# Patient Record
Sex: Female | Born: 1935 | Race: White | Hispanic: No | Smoking: Never smoker
Health system: Southern US, Community
[De-identification: ages and names within clinical notes are randomized; demographics above are authoritative.]

## PROBLEM LIST (undated history)

## (undated) DIAGNOSIS — M1990 Unspecified osteoarthritis, unspecified site: Secondary | ICD-10-CM

## (undated) DIAGNOSIS — E785 Hyperlipidemia, unspecified: Secondary | ICD-10-CM

## (undated) DIAGNOSIS — I10 Essential (primary) hypertension: Secondary | ICD-10-CM

## (undated) DIAGNOSIS — F329 Major depressive disorder, single episode, unspecified: Secondary | ICD-10-CM

## (undated) DIAGNOSIS — F32A Depression, unspecified: Secondary | ICD-10-CM

---

## 2013-10-10 ENCOUNTER — Emergency Department (HOSPITAL_COMMUNITY): Payer: Medicare Other

## 2013-10-10 ENCOUNTER — Emergency Department (HOSPITAL_COMMUNITY)
Admission: EM | Admit: 2013-10-10 | Discharge: 2013-10-10 | Disposition: A | Discharge status: Home / Self Care | Payer: Medicare Other | Attending: Emergency Medicine | Admitting: Emergency Medicine

## 2013-10-10 ENCOUNTER — Encounter (HOSPITAL_COMMUNITY): Payer: Self-pay | Admitting: *Deleted

## 2013-10-10 ENCOUNTER — Ambulatory Visit (HOSPITAL_COMMUNITY)
Admission: RE | Admit: 2013-10-10 | Discharge: 2013-10-10 | Disposition: A | Discharge status: Home / Self Care | Payer: Medicare Other | Attending: Psychiatry | Admitting: Psychiatry

## 2013-10-10 ENCOUNTER — Encounter (HOSPITAL_COMMUNITY): Payer: Self-pay | Admitting: Emergency Medicine

## 2013-10-10 DIAGNOSIS — F3289 Other specified depressive episodes: Secondary | ICD-10-CM | POA: Insufficient documentation

## 2013-10-10 DIAGNOSIS — F329 Major depressive disorder, single episode, unspecified: Secondary | ICD-10-CM | POA: Insufficient documentation

## 2013-10-10 DIAGNOSIS — G479 Sleep disorder, unspecified: Secondary | ICD-10-CM | POA: Insufficient documentation

## 2013-10-10 DIAGNOSIS — R630 Anorexia: Secondary | ICD-10-CM | POA: Insufficient documentation

## 2013-10-10 DIAGNOSIS — R634 Abnormal weight loss: Secondary | ICD-10-CM | POA: Insufficient documentation

## 2013-10-10 DIAGNOSIS — R5381 Other malaise: Secondary | ICD-10-CM | POA: Insufficient documentation

## 2013-10-10 DIAGNOSIS — F1310 Sedative, hypnotic or anxiolytic abuse, uncomplicated: Secondary | ICD-10-CM | POA: Insufficient documentation

## 2013-10-10 LAB — ETHANOL: Alcohol, Ethyl (B): <11 mg/dL (ref 0–11)

## 2013-10-10 LAB — COMPREHENSIVE METABOLIC PANEL
Alkaline Phosphatase: 62 U/L (ref 39–117)
BUN: 18 mg/dL (ref 6–23)
Chloride: 100 mEq/L (ref 96–112)
GFR calc Af Amer: >90 mL/min (ref >90)
GFR calc non Af Amer: 84 mL/min — ABNORMAL LOW (ref >90)
Glucose, Bld: 109 mg/dL — ABNORMAL HIGH (ref 70–99)
Potassium: 3.7 mEq/L (ref 3.5–5.1)
Total Bilirubin: 0.2 mg/dL — ABNORMAL LOW (ref 0.3–1.2)
Total Protein: 7.7 g/dL (ref 6.0–8.3)

## 2013-10-10 LAB — CBC
HCT: 39.9 % (ref 36.0–46.0)
Hemoglobin: 13.7 g/dL (ref 12.0–15.0)
MCH: 30.1 pg (ref 26.0–34.0)
MCHC: 34.3 g/dL (ref 30.0–36.0)
WBC: 9 10*3/uL (ref 4.0–10.5)

## 2013-10-10 LAB — RAPID URINE DRUG SCREEN, HOSP PERFORMED: Barbiturates: NOT DETECTED

## 2013-10-10 LAB — ACETAMINOPHEN LEVEL: Acetaminophen (Tylenol), Serum: <15 ug/mL (ref 10–30)

## 2013-10-10 MED ORDER — ACETAMINOPHEN 325 MG PO TABS: 650.0000 mg | ORAL_TABLET | ORAL | Status: DC | PRN

## 2013-10-10 NOTE — ED Notes (Signed)
Pt.'s personal belongs endorsed to [facility],2 bags.

## 2013-10-10 NOTE — BH Assessment (Signed)
LCSW spoke to Amb[place] at [facility] [phone]) who reports they are accepting patient and have a bed available.  Hardie Lora, RN notified who will communicate this to patient.  LCSW notified Hardie Lora, RN, that patient is being accepted to the care of Dr. Robet Leu and RN is to call report to [phone].  LCSW spoke to Dr. Elesa Massed to notify of patient being accepted to [facility].    Tessa Lerner, LCSW, MSW 8:34 PM 10/10/2013

## 2013-10-10 NOTE — ED Provider Notes (Signed)
CSN: [id number]     Arrival date & time 10/10/13  1508 History   First MD Initiated Contact with Patient 10/10/13 1528     Chief Complaint  Patient presents with  . Medical Clearance    HPI  Bertrice Leder is a 79 y.o. female with a PMH of depression who presents to the ED for evaluation of medical clearance.  History was provided by the patient.  Patient states that she has been depressed for the past 4 weeks. She states that she has a history of depression. She was [place] hometown for [place] a few weeks ago. Her anti-depressive medications were changed at that time. She was previously on Paxil and is now on Cymbalta. She states she's been taking medications however they are "not working". She states that she has not been eating.  She has had a 20 pound weight loss in the past 2 months. She said nothing taste good and she is just not hungry. She states that her husband death about a year ago has been hard on her. She also has to care for her son who is a paraplegic. She states she is here because she "needs help."  She denies any suicidal ideation or attempts prior to arrival. She denies any alcohol or drug use. No tobacco use.  She has a chronic eye condition which is being managed (glaucoma) with no acute changes.  No fever, chills, abdominal pain, nausea, vomiting, chest pain, shortness of breath, myalgias, or leg edema.      History reviewed. No pertinent past medical history. History reviewed. No pertinent past surgical history. No family history on file. History  Substance Use Topics  . Smoking status: Never Smoker   . Smokeless tobacco: Not on file  . Alcohol Use: No   OB History   Grav Para Term Preterm Abortions TAB SAB Ect Mult Living                 Review of Systems  Constitutional: Positive for appetite change, fatigue and unexpected weight change. Negative for fever, chills, diaphoresis and activity change.  HENT: Negative for congestion,  rhinorrhea and sore throat.   Eyes: Positive for pain (at baseline) and visual disturbance (at baseline).  Respiratory: Negative for cough and shortness of breath.   Cardiovascular: Negative for chest pain and leg swelling.  Gastrointestinal: Negative for nausea, vomiting, abdominal pain, diarrhea and constipation.  Genitourinary: Negative for dysuria.  Musculoskeletal: Negative for back pain, gait problem and neck pain.  Skin: Negative for wound.  Neurological: Negative for syncope, weakness and headaches.  Psychiatric/Behavioral: Positive for sleep disturbance and dysphoric mood. Negative for suicidal ideas and self-injury.    Allergies  Review of patient's allergies indicates not on file.  Home Medications  No current outpatient prescriptions on file. BP 148/84  Pulse 85  Temp(Src) 97.7 F (36.5 C) (Oral)  Resp 16  SpO2 97%  Filed Vitals:   10/10/13 1530 10/10/13 1827 10/10/13 2027  BP: 148/84 144/56 152/69  Pulse: 85 78 84  Temp: 97.7 F (36.5 C)    TempSrc: Oral    Resp: 16 18 18   SpO2: 97% 98% 95%     Physical Exam  Nursing note and vitals reviewed. Constitutional: She is oriented to person, place, and time. She appears well-developed and well-nourished. No distress.  HENT:  Head: Normocephalic and atraumatic.  Right Ear: External ear normal.  Left Ear: External ear normal.  Nose: Nose normal.  Mouth/Throat: Oropharynx is clear  and moist.  Eyes: Conjunctivae are normal. Right eye exhibits no discharge. Left eye exhibits no discharge.  Neck: Normal range of motion. Neck supple.  Cardiovascular: Normal rate, regular rhythm and normal heart sounds.  Exam reveals no gallop and no friction rub.   No murmur heard. Pulmonary/Chest: Effort normal and breath sounds normal. No respiratory distress. She has no wheezes. She has no rales. She exhibits no tenderness.  Abdominal: Soft. She exhibits no distension. There is no tenderness.  Musculoskeletal: Normal range of  motion. She exhibits no edema and no tenderness.  Neurological: She is alert and oriented to person, place, and time.  Skin: Skin is warm and dry. She is not diaphoretic.    ED Course  Procedures (including critical care time) Labs Review Labs Reviewed  ACETAMINOPHEN LEVEL  CBC  COMPREHENSIVE METABOLIC PANEL  ETHANOL  SALICYLATE LEVEL  URINE RAPID DRUG SCREEN (HOSP PERFORMED)   Imaging Review No results found.  EKG Interpretation     Ventricular Rate:  77 PR Interval:  167 QRS Duration: 92 QT Interval:  397 QTC Calculation: 449 R Axis:   6 Text Interpretation:  Sinus rhythm Otherwise normal ECG           Results for orders placed during the hospital encounter of 10/10/13  ACETAMINOPHEN LEVEL      Result Value Range   Acetaminophen (Tylenol), Serum <15.0  10 - 30 ug/mL  CBC      Result Value Range   WBC 9.0  4.0 - 10.5 K/uL   RBC 4.55  3.87 - 5.11 MIL/uL   Hemoglobin 13.7  12.0 - 15.0 g/dL   HCT 16.1  09.6 - 04.5 %   MCV 87.7  78.0 - 100.0 fL   MCH 30.1  26.0 - 34.0 pg   MCHC 34.3  30.0 - 36.0 g/dL   RDW 40.9  81.1 - 91.4 %   Platelets 290  150 - 400 K/uL  COMPREHENSIVE METABOLIC PANEL      Result Value Range   Sodium 138  135 - 145 mEq/L   Potassium 3.7  3.5 - 5.1 mEq/L   Chloride 100  96 - 112 mEq/L   CO2 26  19 - 32 mEq/L   Glucose, Bld 109 (*) 70 - 99 mg/dL   BUN 18  6 - 23 mg/dL   Creatinine, Ser 7.82  0.50 - 1.10 mg/dL   Calcium 9.8  8.4 - 95.6 mg/dL   Total Protein 7.7  6.0 - 8.3 g/dL   Albumin 4.0  3.5 - 5.2 g/dL   AST 22  0 - 37 U/L   ALT 16  0 - 35 U/L   Alkaline Phosphatase 62  39 - 117 U/L   Total Bilirubin 0.2 (*) 0.3 - 1.2 mg/dL   GFR calc non Af Amer 84 (*) >90 mL/min   GFR calc Af Amer >90  >90 mL/min  ETHANOL      Result Value Range   Alcohol, Ethyl (B) <11  0 - 11 mg/dL  SALICYLATE LEVEL      Result Value Range   Salicylate Lvl <2.0 (*) 2.8 - 20.0 mg/dL  URINE RAPID DRUG SCREEN (HOSP PERFORMED)      Result Value Range    Opiates NONE DETECTED  NONE DETECTED   Cocaine NONE DETECTED  NONE DETECTED   Benzodiazepines POSITIVE (*) NONE DETECTED   Amphetamines NONE DETECTED  NONE DETECTED   Tetrahydrocannabinol NONE DETECTED  NONE DETECTED   Barbiturates NONE DETECTED  NONE DETECTED    DG Chest 2 View (Final result)  Result time: 10/10/13 17:13:51    Final result by Rad Results In Interface (10/10/13 17:13:51)    Narrative:   CLINICAL DATA: Chest pain. Hypertension.  EXAM: CHEST 2 VIEW  COMPARISON: None.  FINDINGS: The heart size and mediastinal contours are within normal limits. Both lungs are clear. The visualized skeletal structures are unremarkable.  IMPRESSION: No active cardiopulmonary disease.   Electronically Signed By: Myles Rosenthal M.D. On: 10/10/2013 17:13     MDM   1. Depression     Lailee Hoelzel is a 79 y.o. female with a PMH of depression who presents to the ED for evaluation of medical clearance.   Acetaminophen level, CBC, CMP, ethanol, salicylate level, urine drug screen, EKG and chest x-ray ordered for clearance.   Consults  4:11 PM = Spoke with behavioral health.  Patient will be sent to [facility] transfer medical clearance.     Patient medically cleared.  Awaiting bed at [facility] for transfer and further evaluation and management of her depression.  No SI/HI.  Patient in agreement with discharge and plan.     Final impressions: 1. Depression   Luiz Iron PA-C   This patient was discussed with Dr. Meryl Dare, PA-C 10/11/13 1038
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
## 2013-10-10 NOTE — ED Notes (Addendum)
Pt presents to ed with c/o "severe depression". Pt reports feeling hopeless ever since her husband passed away year ago. Pt sts she has no will to live any longer but denies any SI/HI. Pt sts" I don't feel like there is anything to live for, but I would never do anything to hurt myself."  Daughter at bedside sts pt is not eating or drinking almost at all and refuses to get out of bed.

## 2013-10-10 NOTE — ED Provider Notes (Signed)
Medical screening examination/treatment/procedure(s) were conducted as a shared visit with non-physician practitioner(s) and myself.  I personally evaluated the patient during the encounter.  EKG Interpretation     Ventricular Rate:  77 PR Interval:  167 QRS Duration: 92 QT Interval:  397 QTC Calculation: 449 R Axis:   6 Text Interpretation:  Sinus rhythm Otherwise normal ECG            Patient is a 79 year old female with a history of depression currently on Cymbalta presents emergency department with worsening depression over the past several weeks since her husband died. No suicidal or homicidal ideation. No delusions or hallucinations. No current medical complaints. Behavioral health seen her and she's been accepted to her facility in [place]. She has agreed to voluntary treatment. Labs, chest x-ray unremarkable. EKG within normal limits.  Lindsey Maw Zahara Rembert, DO 10/10/13 1801

## 2013-10-10 NOTE — Progress Notes (Signed)
Patient confirms her pcp is Dr.  Delorise Jackson.  System updated.

## 2013-10-10 NOTE — ED Notes (Addendum)
Please call pt's daughter Dewayne Hatch when pt is ready for transfer.

## 2013-10-10 NOTE — Progress Notes (Addendum)
Contacted [facility] [phone]) to inquire about bed availability, per Delorise Shiner, possible availability in the morning, referral faxed to be reviewed. Rodman Pickle, MHT  Referral has also been faxed to [facility]. Debbe Odea Astryd Pearcy,MHT

## 2013-10-10 NOTE — ED Notes (Signed)
Patient's daughter has patient's belongings.

## 2013-10-10 NOTE — ED Notes (Addendum)
Pt belongings: underwear x 5, bra x2,   1 pink nightgown,  1 pink housecoat, pink/white pajamas (top and bottom) 1 pair, light blue gown, socks x 3 pairs, plastic shower cap, hair, brush, hair comb 2 lotions, 1 bottle of hair spray, 1 deodorant, 1 lipstick, 1 black turtle neck blouse, 1 beige pants, 1 kaki pants,1 brown jeans, 1 white sweater,1 white sweater, 1 pair of black slip on shoes locked in locker # 32

## 2013-10-10 NOTE — ED Notes (Signed)
Bed: ZO10 Expected date:  Expected time:  Means of arrival:  Comments: [facility]- [place]man[place], SI

## 2014-03-13 IMAGING — CR DG CHEST 2V
2 series · 2 of 2 positions shown · non-contrast
Comparison: None.

CLINICAL DATA: Chest pain. Hypertension.

EXAM:
CHEST  2 VIEW

[w chest pa]
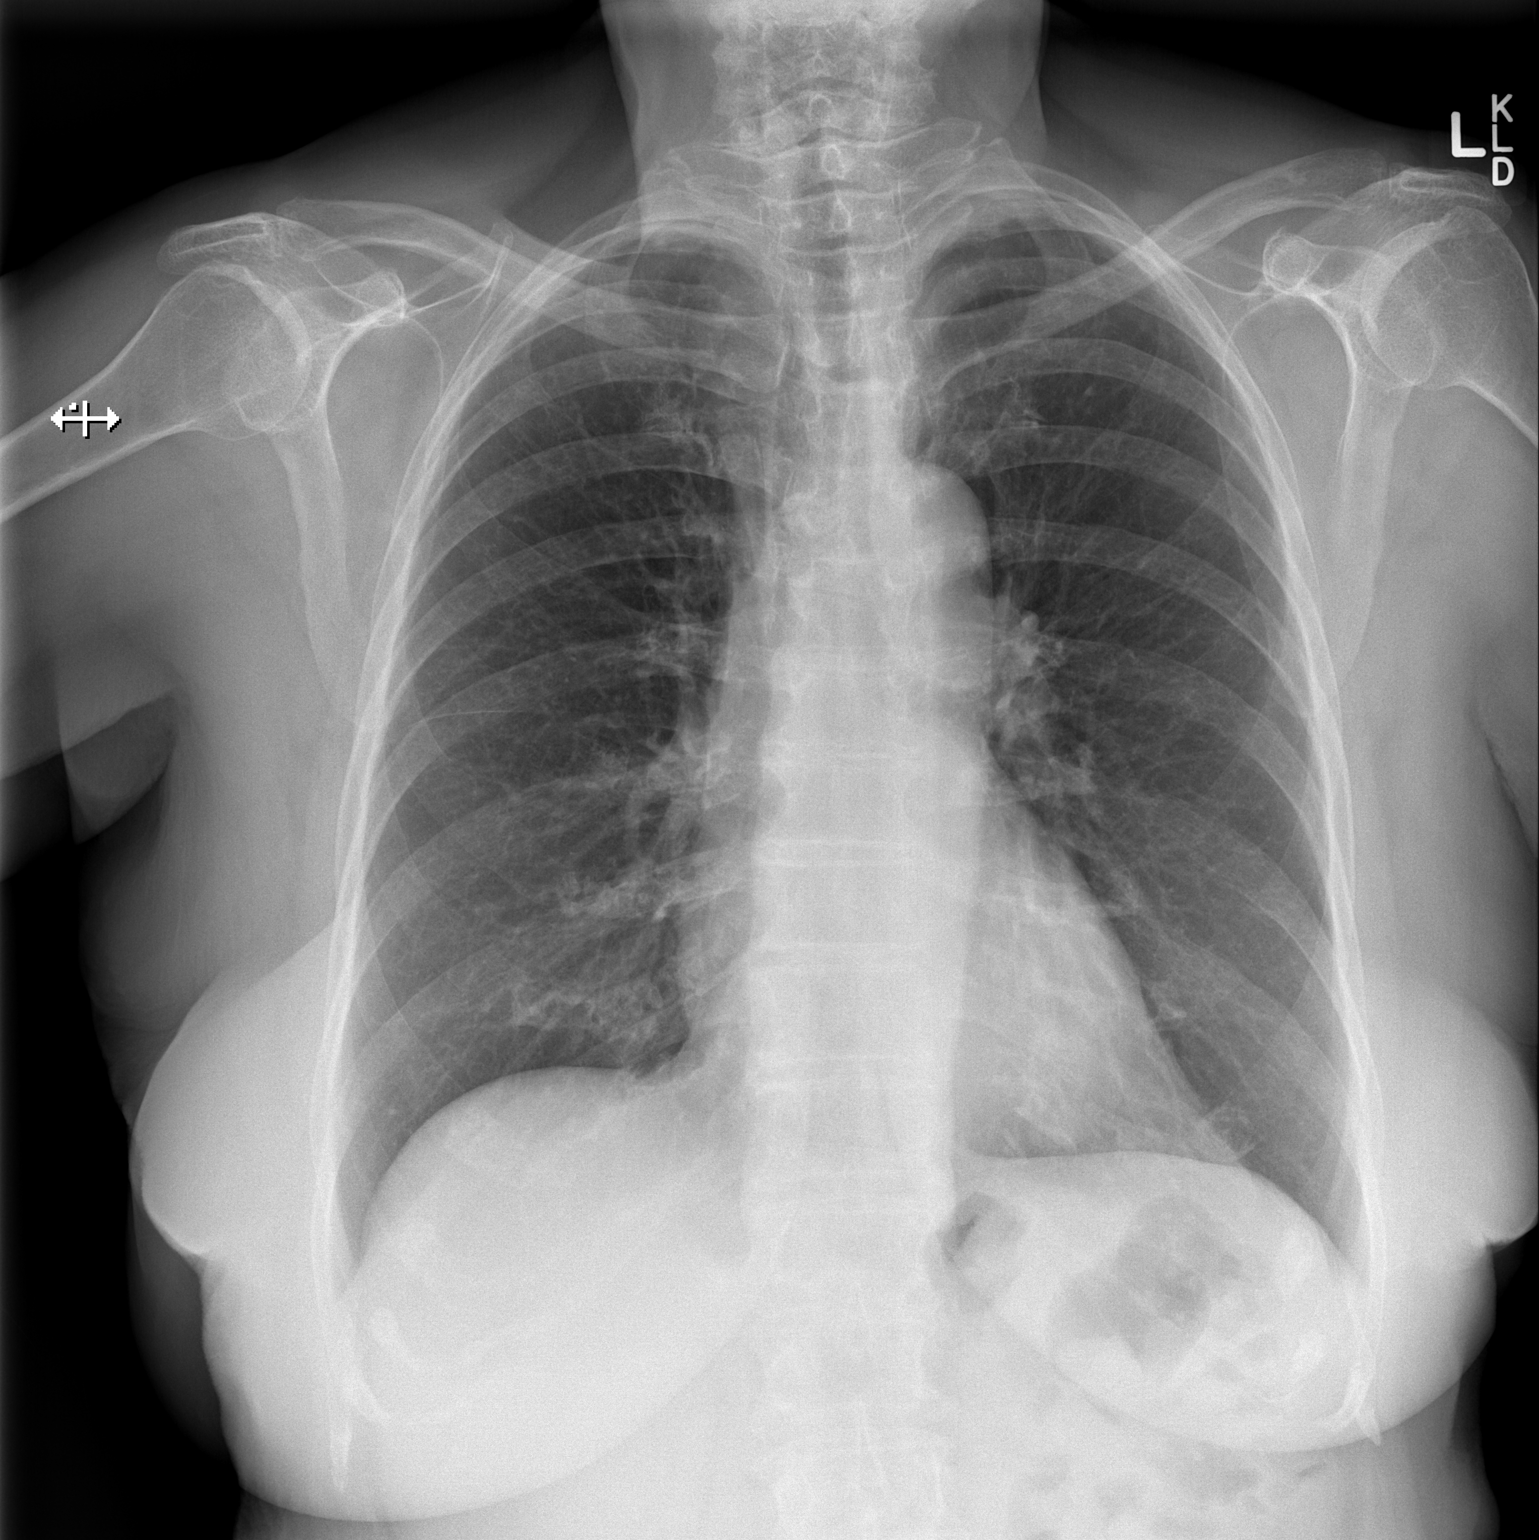

[w chest lat]
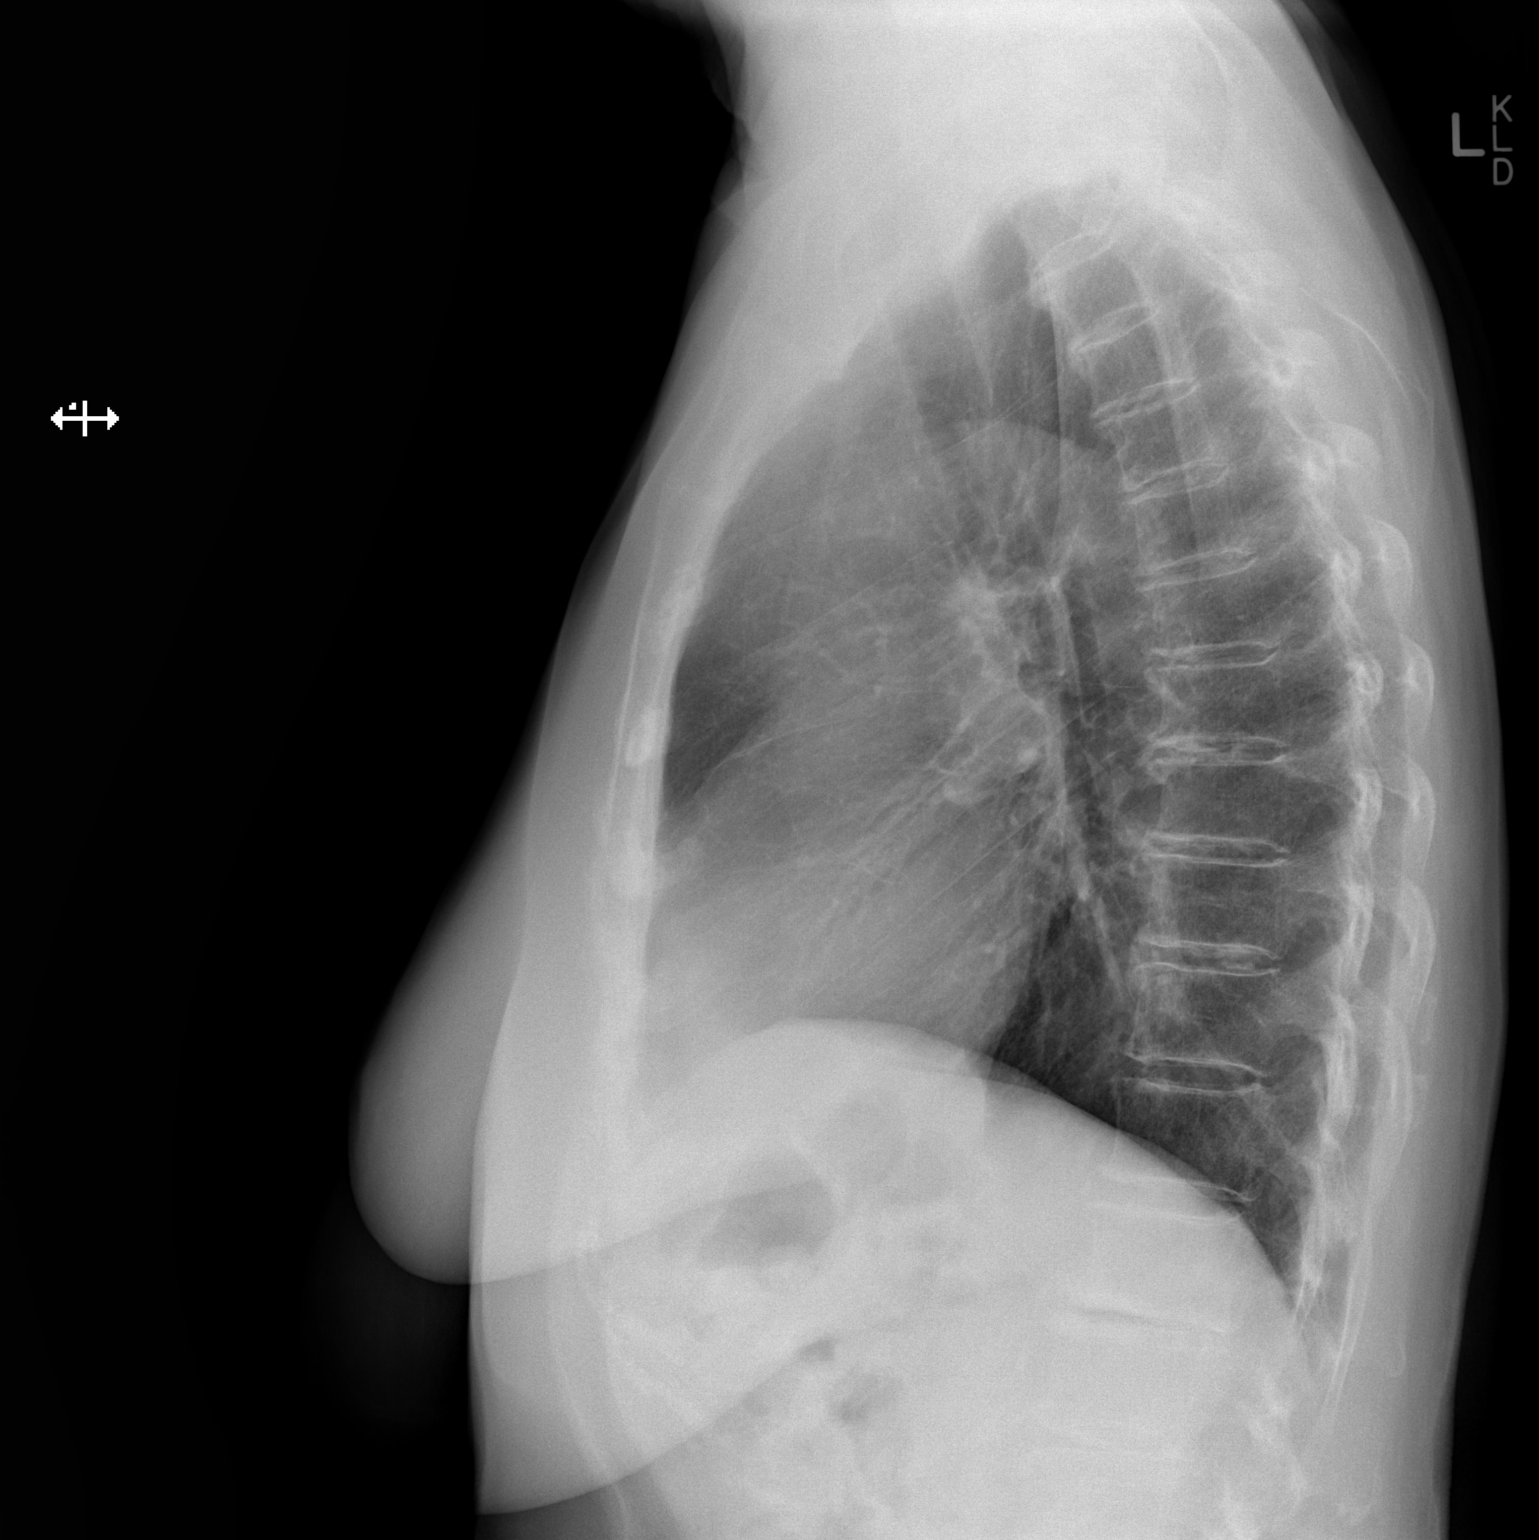

[2 of 2 positions shown; findings below may reference images not displayed]

FINDINGS: The heart size and mediastinal contours are within normal limits.
Both lungs are clear. The visualized skeletal structures are
unremarkable.
IMPRESSION: No active cardiopulmonary disease.

## 2014-04-09 ENCOUNTER — Encounter (HOSPITAL_COMMUNITY): Payer: Self-pay | Admitting: Emergency Medicine

## 2014-04-09 ENCOUNTER — Encounter (HOSPITAL_COMMUNITY): Payer: Self-pay

## 2014-04-09 ENCOUNTER — Ambulatory Visit (HOSPITAL_COMMUNITY)
Admission: RE | Admit: 2014-04-09 | Discharge: 2014-04-09 | Disposition: A | Discharge status: Home / Self Care | Payer: Medicare Other | Source: Home / Self Care | Attending: Psychiatry | Admitting: Psychiatry

## 2014-04-09 ENCOUNTER — Emergency Department (HOSPITAL_COMMUNITY)
Admission: EM | Admit: 2014-04-09 | Discharge: 2014-04-09 | Disposition: A | Discharge status: Other Acute Inpatient Hospital | Payer: Medicare Other | Attending: Emergency Medicine | Admitting: Emergency Medicine

## 2014-04-09 ENCOUNTER — Observation Stay (HOSPITAL_COMMUNITY)
Admission: AD | Admit: 2014-04-09 | Discharge: 2014-04-10 | Disposition: A | Discharge status: Other Acute Inpatient Hospital | Payer: Medicare Other | Source: Intra-hospital | Attending: Psychiatry | Admitting: Psychiatry

## 2014-04-09 DIAGNOSIS — R5381 Other malaise: Secondary | ICD-10-CM | POA: Diagnosis not present

## 2014-04-09 DIAGNOSIS — F329 Major depressive disorder, single episode, unspecified: Secondary | ICD-10-CM

## 2014-04-09 DIAGNOSIS — M129 Arthropathy, unspecified: Secondary | ICD-10-CM | POA: Diagnosis not present

## 2014-04-09 DIAGNOSIS — I10 Essential (primary) hypertension: Secondary | ICD-10-CM | POA: Insufficient documentation

## 2014-04-09 DIAGNOSIS — R5383 Other fatigue: Secondary | ICD-10-CM

## 2014-04-09 DIAGNOSIS — Z008 Encounter for other general examination: Secondary | ICD-10-CM | POA: Diagnosis present

## 2014-04-09 DIAGNOSIS — E785 Hyperlipidemia, unspecified: Secondary | ICD-10-CM | POA: Diagnosis not present

## 2014-04-09 DIAGNOSIS — Z79899 Other long term (current) drug therapy: Secondary | ICD-10-CM | POA: Diagnosis not present

## 2014-04-09 DIAGNOSIS — G479 Sleep disorder, unspecified: Secondary | ICD-10-CM | POA: Diagnosis not present

## 2014-04-09 HISTORY — DX: Hyperlipidemia, unspecified: E78.5

## 2014-04-09 HISTORY — DX: Major depressive disorder, single episode, unspecified: F32.9

## 2014-04-09 HISTORY — DX: Unspecified osteoarthritis, unspecified site: M19.90

## 2014-04-09 HISTORY — DX: Depression, unspecified: F32.A

## 2014-04-09 HISTORY — DX: Essential (primary) hypertension: I10

## 2014-04-09 LAB — RAPID URINE DRUG SCREEN, HOSP PERFORMED
Amphetamines: NOT DETECTED
Barbiturates: NOT DETECTED
Benzodiazepines: NOT DETECTED
COCAINE: NOT DETECTED
Opiates: NOT DETECTED
TETRAHYDROCANNABINOL: NOT DETECTED

## 2014-04-09 LAB — COMPREHENSIVE METABOLIC PANEL
ALT: 15 U/L (ref 0–35)
AST: 19 U/L (ref 0–37)
Albumin: 3.9 g/dL (ref 3.5–5.2)
Alkaline Phosphatase: 61 U/L (ref 39–117)
BUN: 16 mg/dL (ref 6–23)
CHLORIDE: 95 meq/L — AB (ref 96–112)
CO2: 31 meq/L (ref 19–32)
Calcium: 9.8 mg/dL (ref 8.4–10.5)
Creatinine, Ser: 0.71 mg/dL (ref 0.50–1.10)
GFR calc Af Amer: >90 mL/min (ref >90)
GFR, EST NON AFRICAN AMERICAN: 81 mL/min — AB (ref >90)
Glucose, Bld: 120 mg/dL — ABNORMAL HIGH (ref 70–99)
Potassium: 4.1 mEq/L (ref 3.7–5.3)
SODIUM: 137 meq/L (ref 137–147)
Total Bilirubin: 0.4 mg/dL (ref 0.3–1.2)
Total Protein: 7.7 g/dL (ref 6.0–8.3)

## 2014-04-09 LAB — CBC
HEMATOCRIT: 37.4 % (ref 36.0–46.0)
Hemoglobin: 12.5 g/dL (ref 12.0–15.0)
MCH: 29.6 pg (ref 26.0–34.0)
MCHC: 33.4 g/dL (ref 30.0–36.0)
MCV: 88.4 fL (ref 78.0–100.0)
Platelets: 273 10*3/uL (ref 150–400)
RBC: 4.23 MIL/uL (ref 3.87–5.11)
RDW: 13 % (ref 11.5–15.5)
WBC: 8 10*3/uL (ref 4.0–10.5)

## 2014-04-09 LAB — ETHANOL: Alcohol, Ethyl (B): <11 mg/dL (ref 0–11)

## 2014-04-09 MED ORDER — ALUM & MAG HYDROXIDE-SIMETH 200-200-20 MG/5ML PO SUSP: 30.0000 mL | ORAL | Status: DC | PRN

## 2014-04-09 MED ORDER — ACETAMINOPHEN 325 MG PO TABS: 650.0000 mg | ORAL_TABLET | ORAL | Status: DC | PRN

## 2014-04-09 MED ORDER — LOSARTAN POTASSIUM 50 MG PO TABS
50.0000 mg | ORAL_TABLET | Freq: Every day | ORAL | Status: DC
Start: 2014-04-10 — End: 2014-04-11
  Administered 2014-04-10: 50 mg via ORAL
  Filled 2014-04-09 (×2): qty 1

## 2014-04-09 MED ORDER — TRAZODONE HCL 50 MG PO TABS
50.0000 mg | ORAL_TABLET | Freq: Every day | ORAL | Status: DC
  Administered 2014-04-09: 50 mg via ORAL
  Filled 2014-04-09 (×3): qty 1

## 2014-04-09 MED ORDER — LEVOTHYROXINE SODIUM 100 MCG PO TABS
100.0000 ug | ORAL_TABLET | Freq: Every day | ORAL | Status: DC
  Administered 2014-04-10: 100 ug via ORAL
  Filled 2014-04-09 (×3): qty 1

## 2014-04-09 MED ORDER — SIMVASTATIN 20 MG PO TABS
20.0000 mg | ORAL_TABLET | Freq: Every evening | ORAL | Status: DC
  Administered 2014-04-10: 20 mg via ORAL
  Filled 2014-04-09 (×2): qty 1

## 2014-04-09 MED ORDER — MAGNESIUM HYDROXIDE 400 MG/5ML PO SUSP: 30.0000 mL | Freq: Every day | ORAL | Status: DC | PRN

## 2014-04-09 MED ORDER — ATENOLOL 50 MG PO TABS
50.0000 mg | ORAL_TABLET | Freq: Two times a day (BID) | ORAL | Status: DC
  Administered 2014-04-10 (×2): 50 mg via ORAL
  Filled 2014-04-09 (×4): qty 1
  Filled 2014-04-09 (×2): qty 2

## 2014-04-09 MED ORDER — LOSARTAN POTASSIUM-HCTZ 50-12.5 MG PO TABS: 1.0000 | ORAL_TABLET | Freq: Every day | ORAL | Status: DC

## 2014-04-09 MED ORDER — ACETAMINOPHEN 325 MG PO TABS: 650.0000 mg | ORAL_TABLET | Freq: Four times a day (QID) | ORAL | Status: DC | PRN

## 2014-04-09 MED ORDER — HYDROCHLOROTHIAZIDE 12.5 MG PO CAPS
12.5000 mg | ORAL_CAPSULE | Freq: Every day | ORAL | Status: DC
  Administered 2014-04-10: 12.5 mg via ORAL
  Filled 2014-04-09 (×3): qty 1

## 2014-04-09 NOTE — Progress Notes (Signed)
Patient ID: Lindsey Dixon, female   DOB: 04/05/1934, 80 y.o.   MRN: [id number] Pt calm and cooperative. Minimal interaction with staff. -SI/HI, -A/[place]. Denies pain or discomfort. Will continue to monitor for evaluation and stabilization.

## 2014-04-09 NOTE — BH Assessment (Signed)
Assessment Note  Lindsey Dixon is an 80 y.o. female that [facility] as [facility]. She was accompanied by her daughter. She presents today with increased anxiety and depression for the past 2-3 months. She is not eating, sleeping, and displays symptoms of failure to thrive. Patient has loss 20-25 pounds in the past 3 months due to not eating. She stays up most nights pacing. She displays vegetative symptoms with decreased grooming and bathing. She initially denies SI but later sts that he has no desire to live. She was taken by her daughter to the ED last week due to fatigue, dizziness, and dehydration. She was medically cleared and discharged home.  Pt sts that she was upset that the doctors didn't find anything medically wrong with her. The trigger for patient's depression, failure to thrive, and lack of will to live are all related to her son. She has a son with a disability (quadraplegic). She is his caregiver. She has cared for her son for most of his life stating, "I am burnt out". The daughter reports that patient is usually bubbly and energetic stating, "She is simply not herself". [facility] with [facility] symptoms in November 2014. She was referred and hospitalized [facility]. Patient has a psychiatrist -Dr. Knute Neuonrad Duram. She denies HI and AVH's. No alcohol or drug use.   Axis I: Major Depression, Recurrent severe and Anxiety Disorder Nos Axis II: Deferred Axis III:  Past Medical History  Diagnosis Date  . Depression   . Arthritis   . Hypertension   . Hyperlipidemia    Axis IV: other psychosocial or environmental problems, problems related to social environment, problems with access to health care services and problems with primary support group Axis V: 31-40 impairment in reality testing  Past Medical History:  Past Medical History  Diagnosis Date  . Depression   . Arthritis   . Hypertension   . Hyperlipidemia      History reviewed. No pertinent past surgical history.  Family History: History reviewed. No pertinent family history.  Social History:  reports that she has never smoked. She has never used smokeless tobacco. She reports that she does not drink alcohol or use illicit drugs.  Additional Social History:     CIWA:   COWS:    Allergies: No Known Allergies  Home Medications:  Medications Prior to Admission  Medication Sig Dispense Refill  . ALPRAZolam (XANAX) 0.25 MG tablet Take 0.25 mg by mouth at bedtime as needed for sleep or anxiety.      Marland Kitchen. atenolol (TENORMIN) 50 MG tablet Take 50 mg by mouth 2 (two) times daily.      . DULoxetine (CYMBALTA) 60 MG capsule Take 60 mg by mouth daily.      Marland Kitchen. levothyroxine (SYNTHROID, LEVOTHROID) 100 MCG tablet Take 100 mcg by mouth daily before breakfast.      . losartan-hydrochlorothiazide (HYZAAR) 50-12.5 MG per tablet Take 1 tablet by mouth daily.      Marland Kitchen. oxyCODONE-acetaminophen (PERCOCET) 10-325 MG per tablet Take 1 tablet by mouth every 4 (four) hours as needed for pain.      Marland Kitchen. risperiDONE (RISPERDAL) 2 MG tablet Take 2 mg by mouth 2 (two) times daily.      . simvastatin (ZOCOR) 20 MG tablet Take 20 mg by mouth every evening.      . traZODone (DESYREL) 50 MG tablet Take 50 mg by mouth at bedtime.  OB/GYN Status:  No LMP recorded. Patient is postmenopausal.  General Assessment Data Location of Assessment: [facility] ED Is this a Tele or Face-to-Face Assessment?: Face-to-Face Is this an Initial Assessment or a Re-assessment for this encounter?: Initial Assessment Living Arrangements: Alone Can pt return to current living arrangement?: Yes Admission Status: Voluntary Is patient capable of signing voluntary admission?: Yes Transfer from: Acute Hospital Referral Source: Self/Family/Friend     [facility] Crisis [facility] Living Arrangements: Alone Name of Psychiatrist:  (Dr. Knute Neuonrad Duram) Name of Therapist:  (No therapist )  Education Status Is  patient currently in school?: No  Risk to self Suicidal Ideation: Yes-Currently Present Suicidal Intent: Yes-Currently Present Is patient at risk for suicide?: Yes Access to Means: No What has been your use of drugs/alcohol within the last 12 months?:  (patient denies ) Previous Attempts/Gestures: No How many times?:  (0) Other Self Harm Risks:  (0) Intentional Self Injurious Behavior: None Family Suicide History: Unknown Recent stressful life event(s): Other (Comment) (caregiver for disabled son who is a quadraplegic ) Persecutory voices/beliefs?: No Depression: Yes Depression Symptoms: Feeling angry/irritable;Feeling worthless/self pity;Loss of interest in usual pleasures;Isolating;Guilt;Fatigue;Tearfulness;Insomnia;Despondent Substance abuse history and/or treatment for substance abuse?: No Suicide prevention information given to non-admitted patients: Not applicable  Risk to Others Homicidal Ideation: No Thoughts of Harm to Others: No Current Homicidal Intent: No Current Homicidal Plan: No Access to Homicidal Means: No Identified Victim:  (n/a) History of harm to others?: No Assessment of Violence: None Noted Violent Behavior Description:  (patient is calm and cooperative ) Does patient have access to weapons?: No Criminal Charges Pending?: No Does patient have a court date: No  Psychosis Hallucinations: None noted Delusions: None noted  Mental Status Report Appear/Hygiene: Disheveled Eye Contact: Good Motor Activity: Freedom of movement Speech: Logical/coherent Level of Consciousness: Alert Mood: Depressed Affect: Appropriate to circumstance Anxiety Level: None Thought Processes: Coherent Judgement: Unimpaired Orientation: Person;Place;Time;Situation Obsessive Compulsive Thoughts/Behaviors: None  Cognitive Functioning Concentration: Decreased Memory: Recent Intact;Remote Intact IQ: Average Insight: Fair Impulse Control: Good Appetite: Poor Weight Loss:   (20-25 pounds in 3 months ) Weight Gain:  (none reported ) Sleep: Decreased Total Hours of Sleep:  ("I pace all night") Vegetative Symptoms: None  ADLScreening [facility] Assessment Services) Patient's cognitive ability adequate to safely complete daily activities?: Yes Patient able to express need for assistance with ADLs?: No Independently performs ADLs?: Yes (appropriate for developmental age)  Prior Inpatient Therapy Prior Inpatient Therapy: Yes Prior Therapy Dates:  ([facility]-November 2014) Prior Therapy Facilty/Provider(s):  ([facility]) Reason for Treatment:  (depression and suicidal thoughts )  Prior Outpatient Therapy Prior Outpatient Therapy: Yes Prior Therapy Dates:  (Dr. Knute Neuonrad Duram (psychiatrist)-currently) Prior Therapy Facilty/Provider(s):  (Dr. Knute Neuonrad Duram) Reason for Treatment:  (n/a)  ADL Screening (condition at time of admission) Patient's cognitive ability adequate to safely complete daily activities?: Yes Patient able to express need for assistance with ADLs?: No Independently performs ADLs?: Yes (appropriate for developmental age)           Consults Social Work Consult Needed: No      Additional Information 1:1 In Past 12 Months?: No CIRT Risk: No Elopement Risk: No Does patient have medical clearance?: Yes     Disposition:  Disposition Initial Assessment Completed for this Encounter: Yes Disposition of Patient: Other dispositions (Observation Unit) Other disposition(s): Other (Comment) (Observation bed #5 Accepted by Renata Capriceonrad, NP)  On Site Evaluation by:   Reviewed with Physician:    Octaviano Battyoyka Mona Davanna He 04/09/2014 7:18 PM

## 2014-04-09 NOTE — Progress Notes (Signed)
80 year old widowed female admitted for depression. Patient states that she has struggled for a long time but she began to spiral downwards in September of 2014 and in November 2014 she went to [facility] and was admittted for two weeks. Patient again became depressed in January and has gradually gotten worse. Patient contributes her depression to caregiver burnout. Patient has cared for her adult quadriplegic son for 41 years. Since 2013 patient has been his sole caregiver. Patient has an adult daughter that is supportive and has hired [employer]two ladies that are taking care of him now." Patient oriented to unit and placed in scrubs. Nourishment provided.

## 2014-04-09 NOTE — Discharge Instructions (Signed)

## 2014-04-09 NOTE — ED Provider Notes (Signed)
CSN: [id number]     Arrival date & time 04/09/14  1403 History   This chart was scribed for non-physician practitioner Arthor CaptainAbigail Masen Luallen, PA-C, working with Toy BakerAnthony T Allen, MD, by Yevette EdwardsAngela Bracken, ED Scribe. This patient was seen in room [facility] and the patient's care was started at 4:24 PM.  First MD Initiated Contact with Patient 04/09/14 1451     Chief Complaint  Patient presents with  . Medical Clearance    The history is provided by the patient and a relative. No language interpreter was used.   HPI Comments: [name] is a 80 y.o. female who presents to the Emergency Department complaining of depression which has persisted for seven months. [name] reports she was been caring for a quadriplegic son for 41 years; recently outside health aides have begun to assist in caring for the son. Ms. Hinton Dyernman's husband passed away 18 months ago, and she states she is "overwhelmed by the responsibility" of her son and keeping up the house.  She voices she feels like she is "at the end of my rope. I can't hardly face another day." She also states she is "really, really tired and does not have enough energy to do for myself, let alone another person." She affirms she does not want to wake up, but she denies SI. [name] also denies HI, auditory hallucinations, and visual hallucinations. She reports a decreased appetite and she reports that she sleeps only with usage of trazodone. She has a h/o admission for depression at [facility]. The pt's psychologist is Dr. Prince Solianonrad Daub in [place]. She denies a h/o DM.   Past Medical History  Diagnosis Date  . Depression   . Arthritis   . Hypertension   . Hyperlipidemia    History reviewed. No pertinent past surgical history. No family history on file. History  Substance Use Topics  . Smoking status: Never Smoker   . Smokeless tobacco: Not on file  . Alcohol Use: No   OB History   Grav Para Term Preterm Abortions TAB SAB Ect Mult Living                  Review of Systems  Constitutional: Positive for fatigue.  Psychiatric/Behavioral: Positive for sleep disturbance and dysphoric mood. Negative for suicidal ideas and self-injury.  All other systems reviewed and are negative.     Allergies  Review of patient's allergies indicates no known allergies.  Home Medications   Prior to Admission medications   Medication Sig Start Date End Date Taking? Authorizing Provider  ALPRAZolam Prudy Feeler(XANAX) 0.25 MG tablet Take 0.25 mg by mouth at bedtime as needed for sleep or anxiety.    Historical Provider, MD  atenolol (TENORMIN) 50 MG tablet Take 50 mg by mouth 2 (two) times daily.    Historical Provider, MD  azelastine (ASTELIN) 137 MCG/SPRAY nasal spray Place 2 sprays into the nose 2 (two) times daily as needed for rhinitis. Use in each nostril as directed    Historical Provider, MD  DULoxetine (CYMBALTA) 60 MG capsule Take 60 mg by mouth daily.    Historical Provider, MD  HYDROcodone-acetaminophen (NORCO) 10-325 MG per tablet Take 1 tablet by mouth every 6 (six) hours as needed for pain.    Historical Provider, MD  levothyroxine (SYNTHROID, LEVOTHROID) 100 MCG tablet Take 100 mcg by mouth daily before breakfast.    Historical Provider, MD  losartan-hydrochlorothiazide (HYZAAR) 50-12.5 MG per tablet Take 1 tablet by mouth daily.    Historical Provider,  MD  simvastatin (ZOCOR) 20 MG tablet Take 20 mg by mouth every evening.    Historical Provider, MD  traZODone (DESYREL) 50 MG tablet Take 50 mg by mouth at bedtime.    Historical Provider, MD   Triage Vitals: BP 125/64  Pulse 84  Temp(Src) 98.2 F (36.8 C) (Oral)  Resp 20  SpO2 96%  Physical Exam  Nursing note and vitals reviewed. Constitutional: She is oriented to person, place, and time. She appears well-developed and well-nourished. No distress.  HENT:  Head: Normocephalic and atraumatic.  Eyes: EOM are normal.  Neck: Neck supple. No tracheal deviation present.  Cardiovascular: Normal  rate.   Pulmonary/Chest: Effort normal. No respiratory distress.  Musculoskeletal: Normal range of motion.  Neurological: She is alert and oriented to person, place, and time.  Skin: Skin is warm and dry.  Psychiatric: Her behavior is normal.    ED Course  Procedures (including critical care time)  DIAGNOSTIC STUDIES: Oxygen Saturation is 96% on room air, normal by my interpretation.    COORDINATION OF CARE:  4:31 PM- Discussed treatment plan with patient, and the patient agreed to the plan.   Labs Review Labs Reviewed  COMPREHENSIVE METABOLIC PANEL - Abnormal; Notable for the following:    Chloride 95 (*)    Glucose, Bld 120 (*)    GFR calc non Af Amer 81 (*)    All other components within normal limits  CBC  ETHANOL  URINE RAPID DRUG SCREEN (HOSP PERFORMED)    Imaging Review No results found.   EKG Interpretation None      MDM   Final diagnoses:  Major depression    Patient medically clear. [facility]. Discharged for transfer.  I personally performed the services described in this documentation, which was scribed in my presence. The recorded information has been reviewed and is accurate.      Arthor Captainbigail Lyn Joens, PA-C 04/14/14 1112

## 2014-04-09 NOTE — ED Notes (Signed)
Per pt, states she is depressed and has been for months-has had med changes and not feeling better

## 2014-04-10 DIAGNOSIS — F329 Major depressive disorder, single episode, unspecified: Secondary | ICD-10-CM | POA: Diagnosis not present

## 2014-04-10 MED ORDER — ALPRAZOLAM 0.25 MG PO TABS
ORAL_TABLET | ORAL | Status: AC
  Filled 2014-04-10: qty 1

## 2014-04-10 MED ORDER — ALPRAZOLAM 0.25 MG PO TABS
0.2500 mg | ORAL_TABLET | Freq: Four times a day (QID) | ORAL | Status: DC | PRN
  Administered 2014-04-10 (×2): 0.25 mg via ORAL
  Filled 2014-04-10: qty 1

## 2014-04-10 MED ORDER — DULOXETINE HCL 60 MG PO CPEP
60.0000 mg | ORAL_CAPSULE | Freq: Every day | ORAL | Status: DC
  Administered 2014-04-10: 60 mg via ORAL
  Filled 2014-04-10 (×3): qty 1

## 2014-04-10 NOTE — Progress Notes (Signed)
Patient ID: Lindsey Dixon, female   DOB: 03/14/1934, 80 y.o.   MRN: [id number] Wrong note opened have cancelled 3 times but wont cancel?

## 2014-04-10 NOTE — BH Assessment (Addendum)
[facility] Assessment Progress Note  The following facilities have been contacted seeking placement for this pt:  Turner Daniels*[facility] @ [phone]: [facility] @ [phone]; fax printout shows that transmission was completed, but despite many calls I have been unable to reach anyone to confirm receipt.  Voice messages were left requesting call back, but no calls received at this time.  At 17:32 I received a call back from [facility] who confirms receipt of information.  Awaiting decision. Yvetta Coder*[facility] @ [phone]: information faxed to [phone]; they have received information, but they are unlikely to have a bed today. Earlene Plater*[facility] @ [phone]: information faxed to [phone]; information received but they are unlikely to have a bed today.  At 17:24 I received a call declining pt for lack of criteria. Sandre Kitty*[facility] @ [phone]: information faxed to [phone]; left message requesting confirmation of receipt, awaiting call back.  Per Delorise ShinerGrace, information has been received and she will soon staff the pt with their psychiatrist.  At 18:00 I received a call from [place] indicating that she is about to staff the pt with their psychiatrist and requesting some additional information.  She also reports that she will fax a Consent for Admission form for the pt.  Final decision is pending at this time. [facility] @ [phone]: Per Arline Aspindy they do not have a gero-psychiatry unit *[facility] @ [phone]: Per Shanda BumpsJessica they do not have any beds available today. *[facility] @ [phone]: Information faxed to [phone]; Gasper SellsMelinda Stowe acknowledges receipt of information, and reports that they currently have beds available.  Awaiting decision. [facility] @ [phone]: Per Judeth CornfieldStephanie they do not have any beds available today.  Doylene Canninghomas Erendida Wrenn, MA Triage Specialist 04/10/2014 @ 18:19

## 2014-04-10 NOTE — Consult Note (Signed)
[facility] Face-to-FaceObsevation Note  Reason for Consult:  Observation Referring Physician:  ED Provider Lacretia Leigh MD Lindsey Dixon is an 80 y.o. female. Total Time spent with patient: 20 minutes  Assessment: AXIS I:  See current hospital problem list;Failure to Thrive AXIS II:  Deferred AXIS III:   Past Medical History  Diagnosis Date  . Depression   . Arthritis   . Hypertension   . Hyperlipidemia    AXIS IV:  other psychosocial or environmental problems and problems with primary support group AXIS V:  41-50 serious symptoms  Plan:  Recommend psychiatric Inpatient admission when medically cleared.to Geripsychiatric Unit  Subjective:   Lindsey Dixon is a 80 y.o. 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 Severity:  Problem is severe. Timing:  Problem is ongoing (chronic) with acute exacerbations. Context:  Primary provider for her 78 y/o quadraplegic son.  Past Psychiatric History: Past Medical History  Diagnosis Date  . Depression   . Arthritis   . Hypertension   . Hyperlipidemia     reports that she has never smoked. She has never used smokeless tobacco. She reports that she does not drink alcohol or use illicit drugs. History reviewed. No pertinent family history. Family History Substance Abuse: No Family Supports: Yes, List: Living Arrangements: Children Can pt return to current living arrangement?: Yes Abuse/Neglect [facility]) Physical Abuse: Denies Verbal Abuse: Denies Sexual Abuse: Denies Allergies:  No Known Allergies  ACT  Assessment Complete:  Yes:    Educational Status    Risk to Self: Risk to self Suicidal Ideation: Yes-Currently Present Suicidal Intent: Yes-Currently Present Is patient at risk for suicide?: No Access to Means: No What has been your use of drugs/alcohol within the last 12 months?:  (patient denies ) Previous Attempts/Gestures: No How many times?:  (0) Other Self Harm Risks:  (0) Intentional Self Injurious Behavior: None Family Suicide History: Unknown Recent stressful life event(s): Other (Comment) (caregiver for disabled son who is a quadraplegic ) Persecutory voices/beliefs?: No Depression: Yes Depression Symptoms: Feeling angry/irritable;Feeling worthless/self pity;Loss of interest in usual pleasures;Isolating;Guilt;Fatigue;Tearfulness;Insomnia;Despondent Substance abuse history and/or treatment for substance abuse?: No Suicide prevention information given to non-admitted patients: Not applicable  Risk to Others: Risk to Others Homicidal Ideation: No Thoughts of Harm to Others: No Current Homicidal Intent: No Current Homicidal Plan: No Access to Homicidal Means: No Identified Victim:  (n/a) History of harm to others?: No Assessment of Violence: None Noted Violent Behavior Description:  (patient is calm and cooperative ) Does patient have access to weapons?: No Criminal Charges Pending?: No Does patient have a court date: No  Abuse: Abuse/Neglect Assessment (Assessment to be complete while patient is alone) Physical Abuse: Denies Verbal Abuse: Denies Sexual Abuse: Denies Exploitation of patient/patient's resources: Denies Self-Neglect: Denies  Prior Inpatient Therapy: Prior Inpatient Therapy Prior Inpatient Therapy: Yes Prior Therapy Dates:  ([facility]-November 2014) Prior Therapy Facilty/Provider(s):  ([facility]) Reason for Treatment:  (depression and suicidal thoughts )  Prior Outpatient Therapy: Prior Outpatient Therapy Prior Outpatient Therapy: Yes Prior  Therapy Dates:  (Dr. Isaac Laud (psychiatrist)-currently) Prior Therapy Facilty/Provider(s):  (Dr. Isaac Laud) Reason for Treatment:  (n/a)  Additional Information: Additional Information 1:1 In Past 12 Months?: No CIRT Risk: No Elopement Risk: No Does patient have medical clearance?: Yes    ROS:No change from ED ROS 04/09/2014              Objective: Blood pressure 149/82, pulse 77, temperature 98.8 F (37.1 C), temperature source Oral, resp. rate 16, height 5' 2"  (1.575 m), weight 62.596 kg (138 lb).Body mass index is 25.23 kg/(m^2). Results for orders placed during the hospital encounter of 04/09/14 (from the past 72 hour(s))  URINE RAPID DRUG SCREEN (HOSP PERFORMED)     Status: None   Collection Time    04/09/14  2:32 PM      Result Value Ref Range   Opiates NONE DETECTED  NONE DETECTED   Cocaine NONE DETECTED  NONE DETECTED   Benzodiazepines NONE DETECTED  NONE DETECTED   Amphetamines NONE DETECTED  NONE DETECTED   Tetrahydrocannabinol NONE DETECTED  NONE DETECTED   Barbiturates NONE DETECTED  NONE DETECTED   Comment:            DRUG SCREEN FOR MEDICAL PURPOSES     ONLY.  IF CONFIRMATION IS NEEDED     FOR ANY PURPOSE, NOTIFY LAB     WITHIN 5 DAYS.                LOWEST DETECTABLE LIMITS     FOR URINE DRUG SCREEN     Drug Class       Cutoff (ng/mL)     Amphetamine      1000     Barbiturate      200     Benzodiazepine   616     Tricyclics       073     Opiates          300     Cocaine          300     THC              50  CBC     Status: None   Collection Time    04/09/14  2:50 PM      Result Value Ref Range   WBC 8.0  4.0 - 10.5 K/uL   RBC 4.23  3.87 - 5.11 MIL/uL   Hemoglobin 12.5  12.0 - 15.0 g/dL   HCT 37.4  36.0 - 46.0 %   MCV 88.4  78.0 - 100.0 fL   MCH 29.6  26.0 - 34.0 pg   MCHC 33.4  30.0 - 36.0 g/dL   RDW 13.0  11.5 - 15.5 %   Platelets 273  150 - 400 K/uL  COMPREHENSIVE METABOLIC PANEL     Status: Abnormal   Collection Time     04/09/14  2:50 PM      Result Value Ref Range   Sodium 137  137 - 147 mEq/L   Potassium 4.1  3.7 - 5.3 mEq/L   Chloride 95 (*) 96 - 112 mEq/L   CO2 31  19 - 32 mEq/L   Glucose, Bld 120 (*) 70 - 99 mg/dL   BUN 16  6 - 23 mg/dL   Creatinine, Ser 0.71  0.50 - 1.10 mg/dL   Calcium 9.8  8.4 - 10.5 mg/dL   Total Protein 7.7  6.0 - 8.3 g/dL   Albumin 3.9  3.5 - 5.2  g/dL   AST 19  0 - 37 U/L   ALT 15  0 - 35 U/L   Alkaline Phosphatase 61  39 - 117 U/L   Total Bilirubin 0.4  0.3 - 1.2 mg/dL   GFR calc non Af Amer 81 (*) >90 mL/min   GFR calc Af Amer >90  >90 mL/min   Comment: (NOTE)     The eGFR has been calculated using the CKD EPI equation.     This calculation has not been validated in all clinical situations.     eGFR's persistently <90 mL/min signify possible Chronic Kidney     Disease.  ETHANOL     Status: None   Collection Time    04/09/14  2:50 PM      Result Value Ref Range   Alcohol, Ethyl (B) <11  0 - 11 mg/dL   Comment:            LOWEST DETECTABLE LIMIT FOR     SERUM ALCOHOL IS 11 mg/dL     FOR MEDICAL PURPOSES ONLY   Labs are reviewed and are pertinent for normal except Glucose of 120 and thyroid values are unknown  Current Facility-Administered Medications  Medication Dose Route Frequency Provider Last Rate Last Dose  . acetaminophen (TYLENOL) tablet 650 mg  650 mg Oral Q6H PRN Benjamine Mola, FNP      . ALPRAZolam Duanne Moron) tablet 0.25 mg  0.25 mg Oral QID PRN Dara Hoyer, PA-C   0.25 mg at 04/10/14 1520  . alum & mag hydroxide-simeth (MAALOX/MYLANTA) 200-200-20 MG/5ML suspension 30 mL  30 mL Oral Q4H PRN Benjamine Mola, FNP      . atenolol (TENORMIN) tablet 50 mg  50 mg Oral BID Lurena Nida, NP   50 mg at 04/10/14 0750  . DULoxetine (CYMBALTA) DR capsule 60 mg  60 mg Oral Daily Dara Hoyer, PA-C   60 mg at 04/10/14 1024  . losartan (COZAAR) tablet 50 mg  50 mg Oral Daily Nicholaus Bloom, MD   50 mg at 04/10/14 6546   And  . hydrochlorothiazide (MICROZIDE)  capsule 12.5 mg  12.5 mg Oral Daily Nicholaus Bloom, MD   12.5 mg at 04/10/14 0750  . levothyroxine (SYNTHROID, LEVOTHROID) tablet 100 mcg  100 mcg Oral QAC breakfast Lurena Nida, NP   100 mcg at 04/10/14 5035  . magnesium hydroxide (MILK OF MAGNESIA) suspension 30 mL  30 mL Oral Daily PRN Benjamine Mola, FNP      . simvastatin (ZOCOR) tablet 20 mg  20 mg Oral QPM Lurena Nida, NP      . traZODone (DESYREL) tablet 50 mg  50 mg Oral QHS Lurena Nida, NP   50 mg at 04/09/14 2155    Psychiatric Specialty Exam:     Blood pressure 149/82, pulse 77, temperature 98.8 F (37.1 C), temperature source Oral, resp. rate 16, height 5' 2"  (1.575 m), weight 62.596 kg (138 lb).Body mass index is 25.23 kg/(m^2).  General Appearance: Disheveled and depressed appearing  Eye Contact::  Poor  Speech:  Slow  Volume:  Decreased  Mood:  Depressed  Affect:  Congruent  Thought Process:  Goal Directed, Logical and but driven by emotional condition  Orientation:  Full (Time, Place, and Person)  Thought Content:  Rumination  Suicidal Thoughts:  Yes.  without intent/plan  Homicidal Thoughts:  No  Memory:  Remote;   Good  Judgement:  Poor  Insight:  Lacking  Psychomotor Activity:  Psychomotor Retardation  Concentration:  Poor  Recall:  Miller: Fair  Akathisia:  No  Handed:  Right  AIMS (if indicated): NA    Assets:  Social Support  Sleep:   Disturbed   Musculoskeletal: Strength & Muscle Tone: decreased Gait & Station: unsteady Patient leans: Backward  Treatment Plan Summary: Seek admission to [facility].Recheck Thyroid  Dara Hoyer 04/10/2014 3:42 PM

## 2014-04-10 NOTE — Progress Notes (Signed)
Pt alert and cooperative, -SI/HI, -A/V hall. Pt continues to report feeling depressed and overwhelmed. Pt discharged [facility] with [facility]. [facility] by previous shift Charity fundraiserN.

## 2014-04-10 NOTE — Discharge Instructions (Signed)
You have been accepted to [facility].  The [facility] transport agency will be transporting you there:       [facility]      [address], [place], [place] [address]      [phone]

## 2014-04-10 NOTE — BH Assessment (Signed)
[facility] Assessment Progress Note  Per Delorise ShinerGrace at [facility], pt has been accepted by Lowanda FosterBeverly Jones, MD.  Nursing staff is asked to call nurse-to-nurse report to [phone].  Observation Unit nursing staff has been informed.  Doylene Canninghomas Kimmie Doren, MA Triage Specialist 04/10/2014 @ 18:36

## 2014-04-10 NOTE — Progress Notes (Signed)
Patient ID: Lindsey Dixon, female   DOB: 01/20/1934, 80 y.o.   MRN: [id number] Complains of depression and anxiety and states she is used to having Xanax at home and takes it three times a day.Called the extender to get an order for the Xanax and confirmed the order with [facility] [place].The order is for Xanax .25mg  QID prn anxiety.Order put in.and first dose given to her. She states she has been feeling this bad for one year. Believes it is a result of care taking for her son who is a quadriplegic for the past 41 years.He had an accident when he was 78 yo and she just wanted the best for him but the care is becoming too much for her and she is unable to care for herself because of the large commitment she has to him.  She does have some support from hired assistents and also from her daughter. She is losing weight poor sleep and low energy as well as a lot of anxiety.She is considering both she and her son going to a facility, not happy about it but unsure what else to do.She continues to say how worn down she feels and is fretful and anxious.She is quiet, poor eye contact but pleasant when spoken to. She feels she needs to be in the hospital to feel better. She denies being suicidal or homicidal and denies sx of psychosis.She is unclear why she takes Risperdal. She reports poor sleep last HS while here.Eats at meals but small amount.

## 2014-04-14 NOTE — Consult Note (Signed)
Case discussed, agree with plan 

## 2014-04-18 NOTE — ED Provider Notes (Signed)
Medical screening examination/treatment/procedure(s) were performed by non-physician practitioner and as supervising physician I was immediately available for consultation/collaboration.   EKG Interpretation None        Shanikqua Zarzycki, MD 04/18/14 0702 

## 2021-01-06 DIAGNOSIS — H353211 Exudative age-related macular degeneration, right eye, with active choroidal neovascularization: Secondary | ICD-10-CM | POA: Diagnosis not present

## 2021-01-06 DIAGNOSIS — H353223 Exudative age-related macular degeneration, left eye, with inactive scar: Secondary | ICD-10-CM | POA: Diagnosis not present

## 2021-02-05 DIAGNOSIS — M549 Dorsalgia, unspecified: Secondary | ICD-10-CM | POA: Diagnosis not present

## 2021-02-05 DIAGNOSIS — R300 Dysuria: Secondary | ICD-10-CM | POA: Diagnosis not present

## 2021-02-28 DIAGNOSIS — B9681 Helicobacter pylori [H. pylori] as the cause of diseases classified elsewhere: Secondary | ICD-10-CM | POA: Diagnosis not present

## 2021-02-28 DIAGNOSIS — R110 Nausea: Secondary | ICD-10-CM | POA: Diagnosis not present

## 2021-03-10 DIAGNOSIS — H353211 Exudative age-related macular degeneration, right eye, with active choroidal neovascularization: Secondary | ICD-10-CM | POA: Diagnosis not present

## 2021-03-22 DIAGNOSIS — N3281 Overactive bladder: Secondary | ICD-10-CM | POA: Diagnosis not present

## 2021-03-22 DIAGNOSIS — E039 Hypothyroidism, unspecified: Secondary | ICD-10-CM | POA: Diagnosis not present

## 2021-03-22 DIAGNOSIS — E785 Hyperlipidemia, unspecified: Secondary | ICD-10-CM | POA: Diagnosis not present

## 2021-03-22 DIAGNOSIS — I129 Hypertensive chronic kidney disease with stage 1 through stage 4 chronic kidney disease, or unspecified chronic kidney disease: Secondary | ICD-10-CM | POA: Diagnosis not present

## 2021-03-22 DIAGNOSIS — R739 Hyperglycemia, unspecified: Secondary | ICD-10-CM | POA: Diagnosis not present

## 2021-05-05 DIAGNOSIS — H31012 Macula scars of posterior pole (postinflammatory) (post-traumatic), left eye: Secondary | ICD-10-CM | POA: Diagnosis not present

## 2021-05-05 DIAGNOSIS — H353231 Exudative age-related macular degeneration, bilateral, with active choroidal neovascularization: Secondary | ICD-10-CM | POA: Diagnosis not present

## 2021-06-06 DIAGNOSIS — H353211 Exudative age-related macular degeneration, right eye, with active choroidal neovascularization: Secondary | ICD-10-CM | POA: Diagnosis not present

## 2021-06-06 DIAGNOSIS — H353223 Exudative age-related macular degeneration, left eye, with inactive scar: Secondary | ICD-10-CM | POA: Diagnosis not present

## 2021-08-04 DIAGNOSIS — H353211 Exudative age-related macular degeneration, right eye, with active choroidal neovascularization: Secondary | ICD-10-CM | POA: Diagnosis not present

## 2021-08-04 DIAGNOSIS — H353223 Exudative age-related macular degeneration, left eye, with inactive scar: Secondary | ICD-10-CM | POA: Diagnosis not present

## 2021-08-21 DIAGNOSIS — M545 Low back pain, unspecified: Secondary | ICD-10-CM | POA: Diagnosis not present

## 2021-08-21 DIAGNOSIS — M5416 Radiculopathy, lumbar region: Secondary | ICD-10-CM | POA: Diagnosis not present

## 2021-08-21 DIAGNOSIS — R509 Fever, unspecified: Secondary | ICD-10-CM | POA: Diagnosis not present

## 2021-08-21 DIAGNOSIS — M1990 Unspecified osteoarthritis, unspecified site: Secondary | ICD-10-CM | POA: Diagnosis not present

## 2021-09-20 DIAGNOSIS — H353211 Exudative age-related macular degeneration, right eye, with active choroidal neovascularization: Secondary | ICD-10-CM | POA: Diagnosis not present

## 2021-11-18 DIAGNOSIS — H353223 Exudative age-related macular degeneration, left eye, with inactive scar: Secondary | ICD-10-CM | POA: Diagnosis not present

## 2021-11-18 DIAGNOSIS — H353211 Exudative age-related macular degeneration, right eye, with active choroidal neovascularization: Secondary | ICD-10-CM | POA: Diagnosis not present

## 2022-02-19 DIAGNOSIS — M25551 Pain in right hip: Secondary | ICD-10-CM | POA: Diagnosis not present

## 2022-02-19 DIAGNOSIS — M169 Osteoarthritis of hip, unspecified: Secondary | ICD-10-CM | POA: Diagnosis not present

## 2022-02-20 DIAGNOSIS — F325 Major depressive disorder, single episode, in full remission: Secondary | ICD-10-CM | POA: Diagnosis not present

## 2022-02-20 DIAGNOSIS — E785 Hyperlipidemia, unspecified: Secondary | ICD-10-CM | POA: Diagnosis not present

## 2022-02-20 DIAGNOSIS — I129 Hypertensive chronic kidney disease with stage 1 through stage 4 chronic kidney disease, or unspecified chronic kidney disease: Secondary | ICD-10-CM | POA: Diagnosis not present

## 2022-02-20 DIAGNOSIS — F1320 Sedative, hypnotic or anxiolytic dependence, uncomplicated: Secondary | ICD-10-CM | POA: Diagnosis not present

## 2022-02-20 DIAGNOSIS — Z853 Personal history of malignant neoplasm of breast: Secondary | ICD-10-CM | POA: Diagnosis not present

## 2022-02-20 DIAGNOSIS — N1831 Chronic kidney disease, stage 3a: Secondary | ICD-10-CM | POA: Diagnosis not present

## 2022-02-20 DIAGNOSIS — E039 Hypothyroidism, unspecified: Secondary | ICD-10-CM | POA: Diagnosis not present

## 2022-02-20 DIAGNOSIS — F1120 Opioid dependence, uncomplicated: Secondary | ICD-10-CM | POA: Diagnosis not present

## 2022-02-20 DIAGNOSIS — R739 Hyperglycemia, unspecified: Secondary | ICD-10-CM | POA: Diagnosis not present

## 2022-02-24 DIAGNOSIS — M79676 Pain in unspecified toe(s): Secondary | ICD-10-CM | POA: Diagnosis not present

## 2022-02-24 DIAGNOSIS — B351 Tinea unguium: Secondary | ICD-10-CM | POA: Diagnosis not present

## 2022-03-02 DIAGNOSIS — H353211 Exudative age-related macular degeneration, right eye, with active choroidal neovascularization: Secondary | ICD-10-CM | POA: Diagnosis not present

## 2022-03-02 DIAGNOSIS — H353223 Exudative age-related macular degeneration, left eye, with inactive scar: Secondary | ICD-10-CM | POA: Diagnosis not present

## 2022-05-02 DIAGNOSIS — H353223 Exudative age-related macular degeneration, left eye, with inactive scar: Secondary | ICD-10-CM | POA: Diagnosis not present

## 2022-05-02 DIAGNOSIS — H353211 Exudative age-related macular degeneration, right eye, with active choroidal neovascularization: Secondary | ICD-10-CM | POA: Diagnosis not present

## 2022-07-03 DIAGNOSIS — H353223 Exudative age-related macular degeneration, left eye, with inactive scar: Secondary | ICD-10-CM | POA: Diagnosis not present

## 2022-07-03 DIAGNOSIS — H353211 Exudative age-related macular degeneration, right eye, with active choroidal neovascularization: Secondary | ICD-10-CM | POA: Diagnosis not present

## 2022-08-22 DIAGNOSIS — H353211 Exudative age-related macular degeneration, right eye, with active choroidal neovascularization: Secondary | ICD-10-CM | POA: Diagnosis not present

## 2022-10-23 DIAGNOSIS — H353211 Exudative age-related macular degeneration, right eye, with active choroidal neovascularization: Secondary | ICD-10-CM | POA: Diagnosis not present

## 2022-10-23 DIAGNOSIS — H353223 Exudative age-related macular degeneration, left eye, with inactive scar: Secondary | ICD-10-CM | POA: Diagnosis not present

## 2023-01-01 DIAGNOSIS — H353211 Exudative age-related macular degeneration, right eye, with active choroidal neovascularization: Secondary | ICD-10-CM | POA: Diagnosis not present

## 2023-01-29 DIAGNOSIS — N1831 Chronic kidney disease, stage 3a: Secondary | ICD-10-CM | POA: Diagnosis not present

## 2023-01-29 DIAGNOSIS — E785 Hyperlipidemia, unspecified: Secondary | ICD-10-CM | POA: Diagnosis not present

## 2023-01-29 DIAGNOSIS — I129 Hypertensive chronic kidney disease with stage 1 through stage 4 chronic kidney disease, or unspecified chronic kidney disease: Secondary | ICD-10-CM | POA: Diagnosis not present

## 2023-01-29 DIAGNOSIS — M797 Fibromyalgia: Secondary | ICD-10-CM | POA: Diagnosis not present

## 2023-01-29 DIAGNOSIS — Z0000 Encounter for general adult medical examination without abnormal findings: Secondary | ICD-10-CM | POA: Diagnosis not present

## 2023-01-29 DIAGNOSIS — F325 Major depressive disorder, single episode, in full remission: Secondary | ICD-10-CM | POA: Diagnosis not present

## 2023-01-29 DIAGNOSIS — E039 Hypothyroidism, unspecified: Secondary | ICD-10-CM | POA: Diagnosis not present

## 2023-01-29 DIAGNOSIS — F1120 Opioid dependence, uncomplicated: Secondary | ICD-10-CM | POA: Diagnosis not present

## 2023-01-29 DIAGNOSIS — R82998 Other abnormal findings in urine: Secondary | ICD-10-CM | POA: Diagnosis not present

## 2023-01-29 DIAGNOSIS — R739 Hyperglycemia, unspecified: Secondary | ICD-10-CM | POA: Diagnosis not present

## 2023-02-26 DIAGNOSIS — H353211 Exudative age-related macular degeneration, right eye, with active choroidal neovascularization: Secondary | ICD-10-CM | POA: Diagnosis not present

## 2023-02-26 DIAGNOSIS — H353223 Exudative age-related macular degeneration, left eye, with inactive scar: Secondary | ICD-10-CM | POA: Diagnosis not present

## 2023-04-23 DIAGNOSIS — H353211 Exudative age-related macular degeneration, right eye, with active choroidal neovascularization: Secondary | ICD-10-CM | POA: Diagnosis not present

## 2023-06-18 DIAGNOSIS — H353211 Exudative age-related macular degeneration, right eye, with active choroidal neovascularization: Secondary | ICD-10-CM | POA: Diagnosis not present

## 2023-06-18 DIAGNOSIS — H353223 Exudative age-related macular degeneration, left eye, with inactive scar: Secondary | ICD-10-CM | POA: Diagnosis not present

## 2023-08-24 DIAGNOSIS — H353211 Exudative age-related macular degeneration, right eye, with active choroidal neovascularization: Secondary | ICD-10-CM | POA: Diagnosis not present

## 2023-10-02 DIAGNOSIS — F1120 Opioid dependence, uncomplicated: Secondary | ICD-10-CM | POA: Diagnosis not present

## 2023-10-02 DIAGNOSIS — Z1389 Encounter for screening for other disorder: Secondary | ICD-10-CM | POA: Diagnosis not present

## 2023-10-02 DIAGNOSIS — Z0000 Encounter for general adult medical examination without abnormal findings: Secondary | ICD-10-CM | POA: Diagnosis not present

## 2023-10-02 DIAGNOSIS — F325 Major depressive disorder, single episode, in full remission: Secondary | ICD-10-CM | POA: Diagnosis not present

## 2023-10-02 DIAGNOSIS — Z23 Encounter for immunization: Secondary | ICD-10-CM | POA: Diagnosis not present

## 2023-10-02 DIAGNOSIS — I129 Hypertensive chronic kidney disease with stage 1 through stage 4 chronic kidney disease, or unspecified chronic kidney disease: Secondary | ICD-10-CM | POA: Diagnosis not present

## 2023-10-02 DIAGNOSIS — Z853 Personal history of malignant neoplasm of breast: Secondary | ICD-10-CM | POA: Diagnosis not present

## 2023-10-02 DIAGNOSIS — E039 Hypothyroidism, unspecified: Secondary | ICD-10-CM | POA: Diagnosis not present

## 2023-10-02 DIAGNOSIS — N1831 Chronic kidney disease, stage 3a: Secondary | ICD-10-CM | POA: Diagnosis not present

## 2023-10-02 DIAGNOSIS — F1320 Sedative, hypnotic or anxiolytic dependence, uncomplicated: Secondary | ICD-10-CM | POA: Diagnosis not present

## 2023-10-02 DIAGNOSIS — R0602 Shortness of breath: Secondary | ICD-10-CM | POA: Diagnosis not present

## 2023-10-02 DIAGNOSIS — M1990 Unspecified osteoarthritis, unspecified site: Secondary | ICD-10-CM | POA: Diagnosis not present

## 2023-10-23 DIAGNOSIS — H353211 Exudative age-related macular degeneration, right eye, with active choroidal neovascularization: Secondary | ICD-10-CM | POA: Diagnosis not present

## 2023-12-25 DIAGNOSIS — H353223 Exudative age-related macular degeneration, left eye, with inactive scar: Secondary | ICD-10-CM | POA: Diagnosis not present

## 2023-12-25 DIAGNOSIS — H353211 Exudative age-related macular degeneration, right eye, with active choroidal neovascularization: Secondary | ICD-10-CM | POA: Diagnosis not present

## 2024-02-12 DIAGNOSIS — H353211 Exudative age-related macular degeneration, right eye, with active choroidal neovascularization: Secondary | ICD-10-CM | POA: Diagnosis not present

## 2024-04-10 DIAGNOSIS — H353223 Exudative age-related macular degeneration, left eye, with inactive scar: Secondary | ICD-10-CM | POA: Diagnosis not present

## 2024-04-10 DIAGNOSIS — H353211 Exudative age-related macular degeneration, right eye, with active choroidal neovascularization: Secondary | ICD-10-CM | POA: Diagnosis not present

## 2024-05-12 DIAGNOSIS — R739 Hyperglycemia, unspecified: Secondary | ICD-10-CM | POA: Diagnosis not present

## 2024-05-12 DIAGNOSIS — E039 Hypothyroidism, unspecified: Secondary | ICD-10-CM | POA: Diagnosis not present

## 2024-05-12 DIAGNOSIS — E785 Hyperlipidemia, unspecified: Secondary | ICD-10-CM | POA: Diagnosis not present

## 2024-05-12 DIAGNOSIS — Z0000 Encounter for general adult medical examination without abnormal findings: Secondary | ICD-10-CM | POA: Diagnosis not present

## 2024-05-12 DIAGNOSIS — M797 Fibromyalgia: Secondary | ICD-10-CM | POA: Diagnosis not present

## 2024-05-12 DIAGNOSIS — Z23 Encounter for immunization: Secondary | ICD-10-CM | POA: Diagnosis not present

## 2024-05-12 DIAGNOSIS — Z853 Personal history of malignant neoplasm of breast: Secondary | ICD-10-CM | POA: Diagnosis not present

## 2024-05-12 DIAGNOSIS — N1831 Chronic kidney disease, stage 3a: Secondary | ICD-10-CM | POA: Diagnosis not present

## 2024-05-12 DIAGNOSIS — R82998 Other abnormal findings in urine: Secondary | ICD-10-CM | POA: Diagnosis not present

## 2024-05-12 DIAGNOSIS — I129 Hypertensive chronic kidney disease with stage 1 through stage 4 chronic kidney disease, or unspecified chronic kidney disease: Secondary | ICD-10-CM | POA: Diagnosis not present

## 2024-05-12 DIAGNOSIS — E559 Vitamin D deficiency, unspecified: Secondary | ICD-10-CM | POA: Diagnosis not present

## 2024-05-12 DIAGNOSIS — F325 Major depressive disorder, single episode, in full remission: Secondary | ICD-10-CM | POA: Diagnosis not present

## 2024-05-12 DIAGNOSIS — Z1331 Encounter for screening for depression: Secondary | ICD-10-CM | POA: Diagnosis not present

## 2024-05-12 DIAGNOSIS — Z1339 Encounter for screening examination for other mental health and behavioral disorders: Secondary | ICD-10-CM | POA: Diagnosis not present

## 2024-06-05 DIAGNOSIS — H353211 Exudative age-related macular degeneration, right eye, with active choroidal neovascularization: Secondary | ICD-10-CM | POA: Diagnosis not present

## 2024-07-31 DIAGNOSIS — H353211 Exudative age-related macular degeneration, right eye, with active choroidal neovascularization: Secondary | ICD-10-CM | POA: Diagnosis not present

## 2024-07-31 DIAGNOSIS — H353223 Exudative age-related macular degeneration, left eye, with inactive scar: Secondary | ICD-10-CM | POA: Diagnosis not present

## 2024-09-13 DIAGNOSIS — K219 Gastro-esophageal reflux disease without esophagitis: Secondary | ICD-10-CM | POA: Diagnosis not present

## 2024-09-13 DIAGNOSIS — E039 Hypothyroidism, unspecified: Secondary | ICD-10-CM | POA: Diagnosis not present

## 2024-09-13 DIAGNOSIS — H353230 Exudative age-related macular degeneration, bilateral, stage unspecified: Secondary | ICD-10-CM | POA: Diagnosis not present

## 2024-09-13 DIAGNOSIS — G4700 Insomnia, unspecified: Secondary | ICD-10-CM | POA: Diagnosis not present

## 2024-09-13 DIAGNOSIS — I10 Essential (primary) hypertension: Secondary | ICD-10-CM | POA: Diagnosis not present

## 2024-09-13 DIAGNOSIS — F320 Major depressive disorder, single episode, mild: Secondary | ICD-10-CM | POA: Diagnosis not present

## 2024-09-13 DIAGNOSIS — M25561 Pain in right knee: Secondary | ICD-10-CM | POA: Diagnosis not present

## 2024-09-13 DIAGNOSIS — H9193 Unspecified hearing loss, bilateral: Secondary | ICD-10-CM | POA: Diagnosis not present

## 2024-09-13 DIAGNOSIS — E663 Overweight: Secondary | ICD-10-CM | POA: Diagnosis not present

## 2024-10-03 DIAGNOSIS — H353211 Exudative age-related macular degeneration, right eye, with active choroidal neovascularization: Secondary | ICD-10-CM | POA: Diagnosis not present
